# Patient Record
Sex: Female | Born: 1955 | Race: White | Hispanic: No | Marital: Married
Health system: Southern US, Community
[De-identification: ages and names within clinical notes are randomized; demographics above are authoritative.]

---

## 1999-07-03 ENCOUNTER — Other Ambulatory Visit: Admission: RE | Admit: 1999-07-03 | Discharge: 1999-07-03 | Payer: Self-pay | Admitting: *Deleted

## 2000-07-03 ENCOUNTER — Other Ambulatory Visit: Admission: RE | Admit: 2000-07-03 | Discharge: 2000-07-03 | Payer: Self-pay | Admitting: *Deleted

## 2000-07-04 ENCOUNTER — Encounter: Payer: Self-pay | Admitting: Family Medicine

## 2000-07-04 ENCOUNTER — Encounter: Admission: RE | Admit: 2000-07-04 | Discharge: 2000-07-04 | Payer: Self-pay | Admitting: Family Medicine

## 2001-07-20 ENCOUNTER — Ambulatory Visit (HOSPITAL_COMMUNITY): Admission: RE | Admit: 2001-07-20 | Discharge: 2001-07-20 | Payer: Self-pay | Admitting: Family Medicine

## 2001-07-20 ENCOUNTER — Encounter: Payer: Self-pay | Admitting: Family Medicine

## 2002-07-22 ENCOUNTER — Ambulatory Visit (HOSPITAL_COMMUNITY): Admission: RE | Admit: 2002-07-22 | Discharge: 2002-07-22 | Payer: Self-pay | Admitting: Family Medicine

## 2002-07-22 ENCOUNTER — Encounter: Payer: Self-pay | Admitting: Family Medicine

## 2002-07-30 ENCOUNTER — Encounter: Payer: Self-pay | Admitting: Family Medicine

## 2002-07-30 ENCOUNTER — Encounter: Admission: RE | Admit: 2002-07-30 | Discharge: 2002-07-30 | Payer: Self-pay | Admitting: Family Medicine

## 2003-03-17 ENCOUNTER — Encounter: Payer: Self-pay | Admitting: Family Medicine

## 2003-03-17 ENCOUNTER — Encounter: Admission: RE | Admit: 2003-03-17 | Discharge: 2003-03-17 | Payer: Self-pay | Admitting: Family Medicine

## 2003-08-31 ENCOUNTER — Ambulatory Visit (HOSPITAL_COMMUNITY): Admission: RE | Admit: 2003-08-31 | Discharge: 2003-08-31 | Payer: Self-pay | Admitting: Obstetrics & Gynecology

## 2004-08-24 ENCOUNTER — Other Ambulatory Visit: Admission: RE | Admit: 2004-08-24 | Discharge: 2004-08-24 | Payer: Self-pay | Admitting: *Deleted

## 2004-09-27 ENCOUNTER — Encounter: Admission: RE | Admit: 2004-09-27 | Discharge: 2004-09-27 | Payer: Self-pay | Admitting: Family Medicine

## 2004-12-14 ENCOUNTER — Ambulatory Visit (HOSPITAL_COMMUNITY): Admission: RE | Admit: 2004-12-14 | Discharge: 2004-12-14 | Payer: Self-pay | Admitting: Gastroenterology

## 2005-09-09 ENCOUNTER — Other Ambulatory Visit: Admission: RE | Admit: 2005-09-09 | Discharge: 2005-09-09 | Payer: Self-pay | Admitting: *Deleted

## 2005-10-10 ENCOUNTER — Encounter: Admission: RE | Admit: 2005-10-10 | Discharge: 2005-10-10 | Payer: Self-pay | Admitting: Family Medicine

## 2006-09-10 ENCOUNTER — Other Ambulatory Visit: Admission: RE | Admit: 2006-09-10 | Discharge: 2006-09-10 | Payer: Self-pay | Admitting: Family Medicine

## 2006-10-15 ENCOUNTER — Encounter: Admission: RE | Admit: 2006-10-15 | Discharge: 2006-10-15 | Payer: Self-pay | Admitting: Family Medicine

## 2007-09-16 ENCOUNTER — Other Ambulatory Visit: Admission: RE | Admit: 2007-09-16 | Discharge: 2007-09-16 | Payer: Self-pay | Admitting: Family Medicine

## 2007-10-29 ENCOUNTER — Encounter: Admission: RE | Admit: 2007-10-29 | Discharge: 2007-10-29 | Payer: Self-pay | Admitting: Family Medicine

## 2008-09-22 ENCOUNTER — Other Ambulatory Visit: Admission: RE | Admit: 2008-09-22 | Discharge: 2008-09-22 | Payer: Self-pay | Admitting: Family Medicine

## 2008-11-01 ENCOUNTER — Encounter: Admission: RE | Admit: 2008-11-01 | Discharge: 2008-11-01 | Payer: Self-pay | Admitting: Family Medicine

## 2009-09-26 ENCOUNTER — Other Ambulatory Visit: Admission: RE | Admit: 2009-09-26 | Discharge: 2009-09-26 | Payer: Self-pay | Admitting: Family Medicine

## 2009-11-03 ENCOUNTER — Encounter: Admission: RE | Admit: 2009-11-03 | Discharge: 2009-11-03 | Payer: Self-pay | Admitting: Family Medicine

## 2010-11-06 ENCOUNTER — Ambulatory Visit (HOSPITAL_COMMUNITY): Admission: RE | Admit: 2010-11-06 | Payer: Self-pay | Source: Home / Self Care | Admitting: Family Medicine

## 2010-11-06 ENCOUNTER — Encounter
Admission: RE | Admit: 2010-11-06 | Discharge: 2010-11-06 | Payer: Self-pay | Source: Home / Self Care | Attending: Family Medicine | Admitting: Family Medicine

## 2010-11-14 ENCOUNTER — Encounter: Payer: Self-pay | Admitting: Family Medicine

## 2010-11-20 ENCOUNTER — Other Ambulatory Visit: Payer: Self-pay | Admitting: Family Medicine

## 2010-11-20 ENCOUNTER — Other Ambulatory Visit (HOSPITAL_COMMUNITY)
Admission: RE | Admit: 2010-11-20 | Discharge: 2010-11-20 | Disposition: A | Discharge status: Home / Self Care | Payer: BC Managed Care – PPO | Source: Ambulatory Visit | Attending: Family Medicine | Admitting: Family Medicine

## 2010-11-20 DIAGNOSIS — Z124 Encounter for screening for malignant neoplasm of cervix: Secondary | ICD-10-CM | POA: Insufficient documentation

## 2010-11-20 DIAGNOSIS — Z1159 Encounter for screening for other viral diseases: Secondary | ICD-10-CM | POA: Insufficient documentation

## 2011-03-01 NOTE — Op Note (Signed)
NAMECHRISTIAN, [name]                ACCOUNT NO.:  [id number]   MEDICAL RECORD NO.:  [id number]          PATIENT TYPE:  AMB   LOCATION:  ENDO                         FACILITY:  [facility]   PHYSICIAN:  Petra Kuba, M.D.    DATE OF BIRTH:  1956-01-17   DATE OF PROCEDURE:  12/14/2004  DATE OF DISCHARGE:                                 OPERATIVE REPORT   PROCEDURE:  Colonoscopy.   INDICATIONS:  Screening.  Consent was signed after risks, benefits, methods,  and options were thoroughly discussed in the office.   PREMEDICATIONS:  Demerol 70 mg, Versed 7 mg.   DESCRIPTION OF PROCEDURE:  Rectal inspection pertinent for external  hemorrhoids, small.  Digital exam was negative.  Video pediatric adjustable  colonoscope was inserted and with some abdominal pressure able to be  advanced around the colon to the cecum.  No abnormalities were seen on  insertion.  The cecum was identified by the appendiceal orifice and the  ileocecal valve. In fact, the scope was inserted a short ways into the  terminal ileum which was normal.  Further documentation was obtained.  The  scope was slowly withdrawn.  Prep was adequate.  There was some liquid stool  that required washing and suctioning.  On slow withdrawal through the colon,  no abnormalities were seen, specifically no polyps, tumors, masses, or  diverticula.  Once back in the rectum, anorectal pull-through and  retroflexion confirmed some small hemorrhoids.  The scope was straightened  and readvanced a short ways up the left side of the colon.  Air was  suctioned and the scope removed.  The patient tolerated the procedure well.  There were no obvious immediate complications.   ENDOSCOPIC DIAGNOSES:  1.  Small internal and external hemorrhoids.  2.  Tortuous colon.  3.  Otherwise within normal limits to the cecum.   PLAN:  Recheck colon screening every five to 10 years.  Happy to see back  p.r.n.  Otherwise return care to Ms. Clark for the customary  health care  maintenance, including rectals and guaiacs.      MEM/MEDQ  D:  12/14/2004  T:  12/14/2004  Job:  [id number]   cc:   [name], N.P.  [facility]

## 2011-11-04 ENCOUNTER — Other Ambulatory Visit: Payer: Self-pay | Admitting: Family Medicine

## 2011-11-04 DIAGNOSIS — Z1231 Encounter for screening mammogram for malignant neoplasm of breast: Secondary | ICD-10-CM

## 2011-11-13 ENCOUNTER — Ambulatory Visit: Payer: BC Managed Care – PPO

## 2011-11-14 ENCOUNTER — Ambulatory Visit
Admission: RE | Admit: 2011-11-14 | Discharge: 2011-11-14 | Disposition: A | Discharge status: Home / Self Care | Payer: BC Managed Care – PPO | Source: Ambulatory Visit | Attending: Family Medicine | Admitting: Family Medicine

## 2011-11-14 ENCOUNTER — Other Ambulatory Visit: Payer: Self-pay | Admitting: Family Medicine

## 2011-11-14 DIAGNOSIS — Z1231 Encounter for screening mammogram for malignant neoplasm of breast: Secondary | ICD-10-CM

## 2011-12-10 ENCOUNTER — Other Ambulatory Visit: Payer: Self-pay | Admitting: Family Medicine

## 2011-12-19 ENCOUNTER — Other Ambulatory Visit: Payer: BC Managed Care – PPO

## 2011-12-27 ENCOUNTER — Ambulatory Visit
Admission: RE | Admit: 2011-12-27 | Discharge: 2011-12-27 | Disposition: A | Discharge status: Home / Self Care | Payer: BC Managed Care – PPO | Source: Ambulatory Visit | Attending: Family Medicine | Admitting: Family Medicine

## 2012-10-16 ENCOUNTER — Other Ambulatory Visit (HOSPITAL_COMMUNITY): Payer: Self-pay | Admitting: Family Medicine

## 2012-10-16 DIAGNOSIS — Z1231 Encounter for screening mammogram for malignant neoplasm of breast: Secondary | ICD-10-CM

## 2012-11-18 ENCOUNTER — Ambulatory Visit: Payer: BC Managed Care – PPO

## 2012-12-28 ENCOUNTER — Other Ambulatory Visit: Payer: Self-pay

## 2012-12-28 DIAGNOSIS — Z1231 Encounter for screening mammogram for malignant neoplasm of breast: Secondary | ICD-10-CM

## 2013-01-11 ENCOUNTER — Other Ambulatory Visit: Payer: Self-pay | Admitting: Family Medicine

## 2013-01-11 ENCOUNTER — Other Ambulatory Visit (HOSPITAL_COMMUNITY)
Admission: RE | Admit: 2013-01-11 | Discharge: 2013-01-11 | Disposition: A | Discharge status: Home / Self Care | Payer: BC Managed Care – PPO | Source: Ambulatory Visit | Attending: Family Medicine | Admitting: Family Medicine

## 2013-01-11 DIAGNOSIS — Z124 Encounter for screening for malignant neoplasm of cervix: Secondary | ICD-10-CM | POA: Insufficient documentation

## 2013-01-18 ENCOUNTER — Ambulatory Visit: Payer: BC Managed Care – PPO

## 2013-01-19 ENCOUNTER — Ambulatory Visit
Admission: RE | Admit: 2013-01-19 | Discharge: 2013-01-19 | Disposition: A | Discharge status: Home / Self Care | Payer: BC Managed Care – PPO | Source: Ambulatory Visit | Attending: Family Medicine | Admitting: Family Medicine

## 2014-01-12 ENCOUNTER — Other Ambulatory Visit: Payer: Self-pay | Admitting: Family Medicine

## 2014-01-12 DIAGNOSIS — M8580 Other specified disorders of bone density and structure, unspecified site: Secondary | ICD-10-CM

## 2014-01-12 DIAGNOSIS — Z1231 Encounter for screening mammogram for malignant neoplasm of breast: Secondary | ICD-10-CM

## 2014-01-28 ENCOUNTER — Ambulatory Visit
Admission: RE | Admit: 2014-01-28 | Discharge: 2014-01-28 | Disposition: A | Discharge status: Home / Self Care | Payer: BC Managed Care – PPO | Source: Ambulatory Visit | Attending: Family Medicine | Admitting: Family Medicine

## 2014-01-28 DIAGNOSIS — Z1231 Encounter for screening mammogram for malignant neoplasm of breast: Secondary | ICD-10-CM

## 2014-01-28 DIAGNOSIS — M8580 Other specified disorders of bone density and structure, unspecified site: Secondary | ICD-10-CM

## 2014-12-30 ENCOUNTER — Other Ambulatory Visit: Payer: Self-pay

## 2014-12-30 DIAGNOSIS — Z1231 Encounter for screening mammogram for malignant neoplasm of breast: Secondary | ICD-10-CM

## 2015-01-18 ENCOUNTER — Other Ambulatory Visit (HOSPITAL_COMMUNITY)
Admission: RE | Admit: 2015-01-18 | Discharge: 2015-01-18 | Disposition: A | Discharge status: Home / Self Care | Payer: BC Managed Care – PPO | Source: Ambulatory Visit | Attending: Family Medicine | Admitting: Family Medicine

## 2015-01-18 ENCOUNTER — Other Ambulatory Visit: Payer: Self-pay | Admitting: Family Medicine

## 2015-01-18 DIAGNOSIS — Z124 Encounter for screening for malignant neoplasm of cervix: Secondary | ICD-10-CM | POA: Insufficient documentation

## 2015-01-19 LAB — CYTOLOGY - PAP

## 2015-01-30 ENCOUNTER — Ambulatory Visit
Admission: RE | Admit: 2015-01-30 | Discharge: 2015-01-30 | Disposition: A | Discharge status: Home / Self Care | Payer: BC Managed Care – PPO | Source: Ambulatory Visit

## 2015-01-30 ENCOUNTER — Other Ambulatory Visit: Payer: Self-pay

## 2015-01-30 DIAGNOSIS — Z1231 Encounter for screening mammogram for malignant neoplasm of breast: Secondary | ICD-10-CM

## 2015-01-31 ENCOUNTER — Other Ambulatory Visit: Payer: Self-pay | Admitting: Family Medicine

## 2015-01-31 DIAGNOSIS — R928 Other abnormal and inconclusive findings on diagnostic imaging of breast: Secondary | ICD-10-CM

## 2015-02-09 ENCOUNTER — Ambulatory Visit
Admission: RE | Admit: 2015-02-09 | Discharge: 2015-02-09 | Disposition: A | Discharge status: Home / Self Care | Payer: BC Managed Care – PPO | Source: Ambulatory Visit | Attending: Family Medicine | Admitting: Family Medicine

## 2015-02-09 DIAGNOSIS — R928 Other abnormal and inconclusive findings on diagnostic imaging of breast: Secondary | ICD-10-CM

## 2016-01-10 ENCOUNTER — Other Ambulatory Visit: Payer: Self-pay

## 2016-01-10 DIAGNOSIS — Z1231 Encounter for screening mammogram for malignant neoplasm of breast: Secondary | ICD-10-CM

## 2016-01-11 ENCOUNTER — Other Ambulatory Visit: Payer: Self-pay | Admitting: Family Medicine

## 2016-01-11 DIAGNOSIS — M8580 Other specified disorders of bone density and structure, unspecified site: Secondary | ICD-10-CM

## 2016-02-13 ENCOUNTER — Ambulatory Visit
Admission: RE | Admit: 2016-02-13 | Discharge: 2016-02-13 | Disposition: A | Discharge status: Home / Self Care | Payer: BC Managed Care – PPO | Source: Ambulatory Visit | Attending: Family Medicine | Admitting: Family Medicine

## 2016-02-13 ENCOUNTER — Ambulatory Visit
Admission: RE | Admit: 2016-02-13 | Discharge: 2016-02-13 | Disposition: A | Discharge status: Home / Self Care | Payer: BC Managed Care – PPO | Source: Ambulatory Visit

## 2016-02-13 ENCOUNTER — Ambulatory Visit: Payer: BC Managed Care – PPO

## 2016-02-13 DIAGNOSIS — Z1231 Encounter for screening mammogram for malignant neoplasm of breast: Secondary | ICD-10-CM

## 2016-02-13 DIAGNOSIS — M8580 Other specified disorders of bone density and structure, unspecified site: Secondary | ICD-10-CM

## 2017-01-15 ENCOUNTER — Other Ambulatory Visit: Payer: Self-pay | Admitting: Family Medicine

## 2017-01-15 DIAGNOSIS — Z1231 Encounter for screening mammogram for malignant neoplasm of breast: Secondary | ICD-10-CM

## 2017-02-14 ENCOUNTER — Ambulatory Visit: Payer: BC Managed Care – PPO

## 2017-02-17 ENCOUNTER — Other Ambulatory Visit: Payer: Self-pay | Admitting: Family Medicine

## 2017-02-17 ENCOUNTER — Other Ambulatory Visit (HOSPITAL_COMMUNITY)
Admission: RE | Admit: 2017-02-17 | Discharge: 2017-02-17 | Disposition: A | Discharge status: Home / Self Care | Payer: BC Managed Care – PPO | Source: Ambulatory Visit | Attending: Family Medicine | Admitting: Family Medicine

## 2017-02-17 DIAGNOSIS — Z01419 Encounter for gynecological examination (general) (routine) without abnormal findings: Secondary | ICD-10-CM | POA: Diagnosis not present

## 2017-02-18 LAB — CYTOLOGY - PAP: Diagnosis: NEGATIVE

## 2017-02-26 ENCOUNTER — Ambulatory Visit
Admission: RE | Admit: 2017-02-26 | Discharge: 2017-02-26 | Disposition: A | Discharge status: Home / Self Care | Payer: BC Managed Care – PPO | Source: Ambulatory Visit | Attending: Family Medicine | Admitting: Family Medicine

## 2017-02-26 DIAGNOSIS — Z1231 Encounter for screening mammogram for malignant neoplasm of breast: Secondary | ICD-10-CM
